# Patient Record
Sex: Female | Born: 1955 | Race: White | Hispanic: No | Marital: Married
Health system: Southern US, Community
[De-identification: ages and names within clinical notes are randomized; demographics above are authoritative.]

---

## 2006-04-20 ENCOUNTER — Ambulatory Visit (HOSPITAL_COMMUNITY): Admission: RE | Admit: 2006-04-20 | Discharge: 2006-04-20 | Payer: Self-pay | Admitting: General Surgery

## 2006-05-04 ENCOUNTER — Encounter (INDEPENDENT_AMBULATORY_CARE_PROVIDER_SITE_OTHER): Payer: Self-pay | Admitting: Specialist

## 2006-05-04 ENCOUNTER — Ambulatory Visit (HOSPITAL_COMMUNITY): Admission: RE | Admit: 2006-05-04 | Discharge: 2006-05-04 | Payer: Self-pay | Admitting: General Surgery

## 2006-05-30 ENCOUNTER — Ambulatory Visit: Payer: Self-pay | Admitting: Oncology

## 2006-07-20 ENCOUNTER — Ambulatory Visit: Payer: Self-pay | Admitting: Oncology

## 2006-07-24 ENCOUNTER — Ambulatory Visit (HOSPITAL_COMMUNITY): Admission: RE | Admit: 2006-07-24 | Discharge: 2006-07-24 | Payer: Self-pay | Admitting: Oncology

## 2006-07-24 LAB — CBC WITH DIFFERENTIAL/PLATELET
Basophils Absolute: 0 10*3/uL (ref 0.0–0.1)
EOS%: 1.6 % (ref 0.0–7.0)
HCT: 40 % (ref 34.8–46.6)
HGB: 13.6 g/dL (ref 11.6–15.9)
LYMPH%: 30.4 % (ref 14.0–48.0)
MCH: 28 pg (ref 26.0–34.0)
MCV: 82.5 fL (ref 81.0–101.0)
MONO%: 6.4 % (ref 0.0–13.0)
NEUT%: 61.3 % (ref 39.6–76.8)

## 2006-07-24 LAB — COMPREHENSIVE METABOLIC PANEL
AST: 21 U/L (ref 0–37)
Alkaline Phosphatase: 102 U/L (ref 39–117)
BUN: 17 mg/dL (ref 6–23)
Calcium: 9.2 mg/dL (ref 8.4–10.5)
Chloride: 103 mEq/L (ref 96–112)
Creatinine, Ser: 1.06 mg/dL (ref 0.40–1.20)
Total Bilirubin: 0.5 mg/dL (ref 0.3–1.2)

## 2006-07-24 IMAGING — CR DG CHEST 2V
2 series · 2 of 2 positions shown · non-contrast
Comparison: none

CLINICAL DATA: 51 year-old with right calf melanoma.
 CHEST - 2 VIEW:

[view not recorded (1 of 2)]
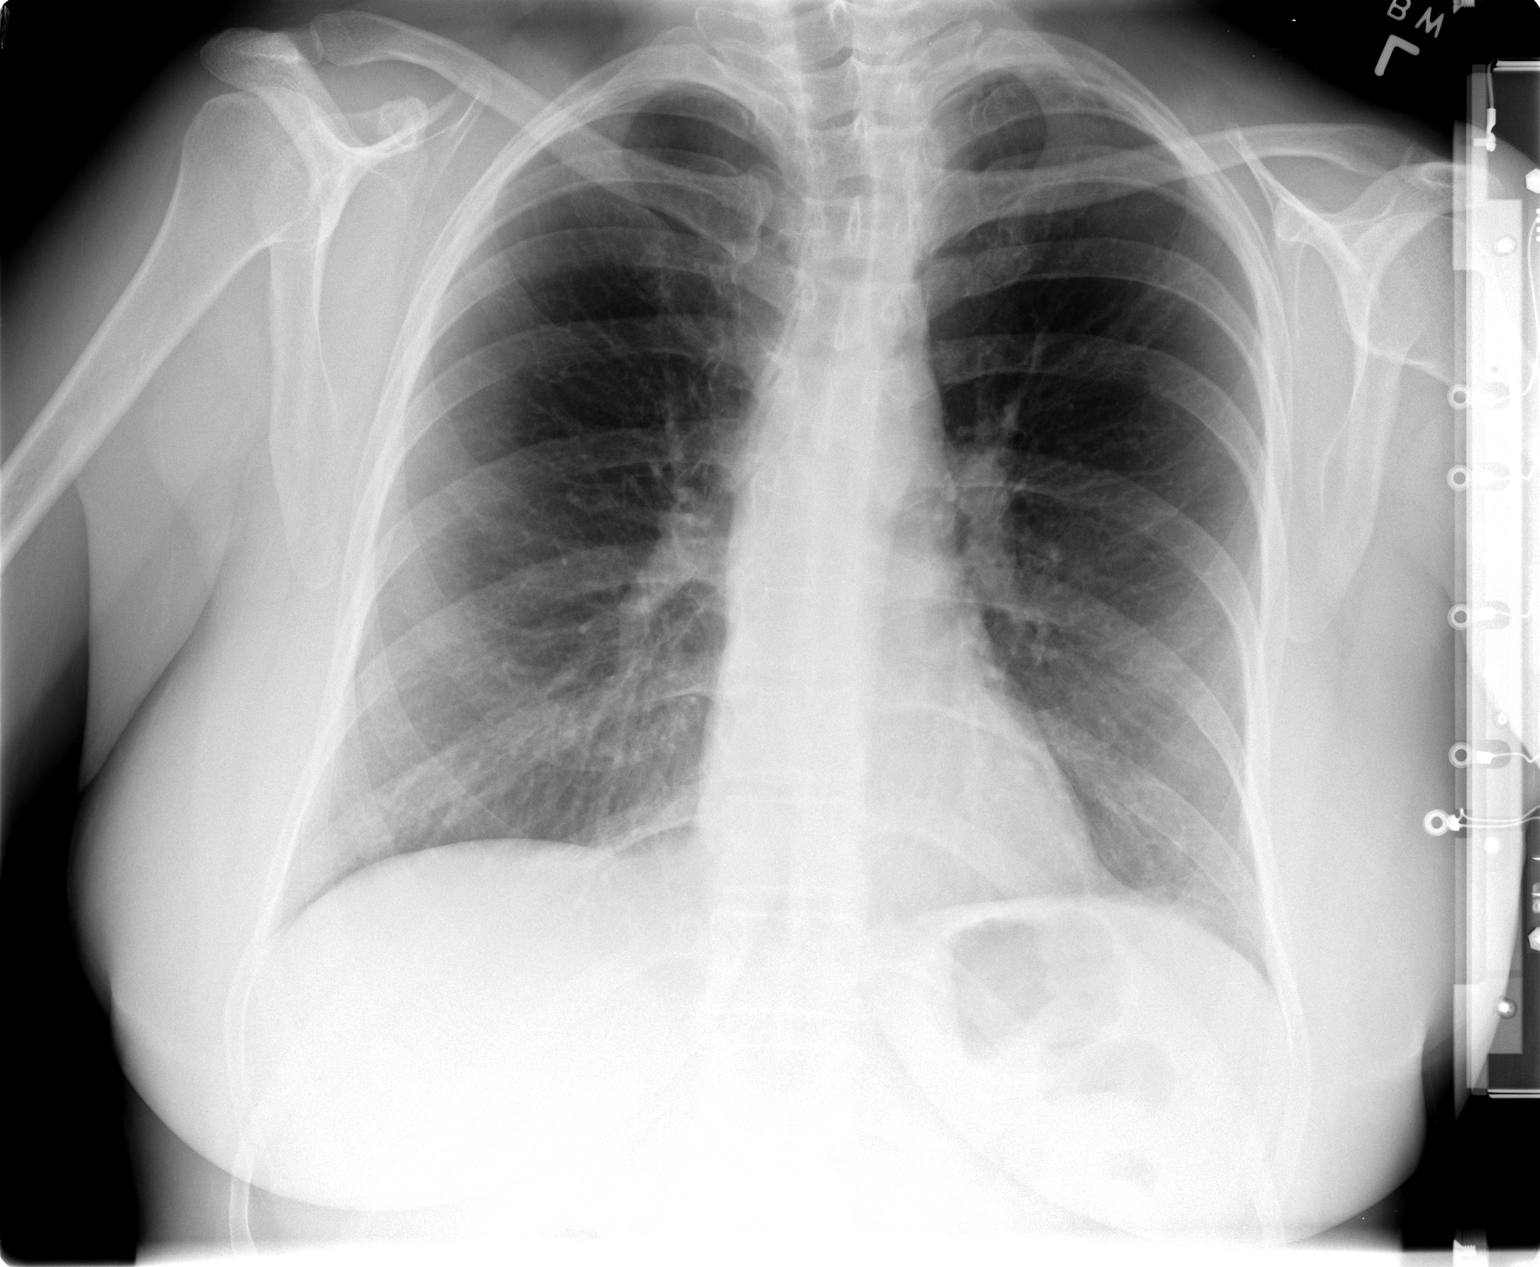

[view not recorded (2 of 2)]
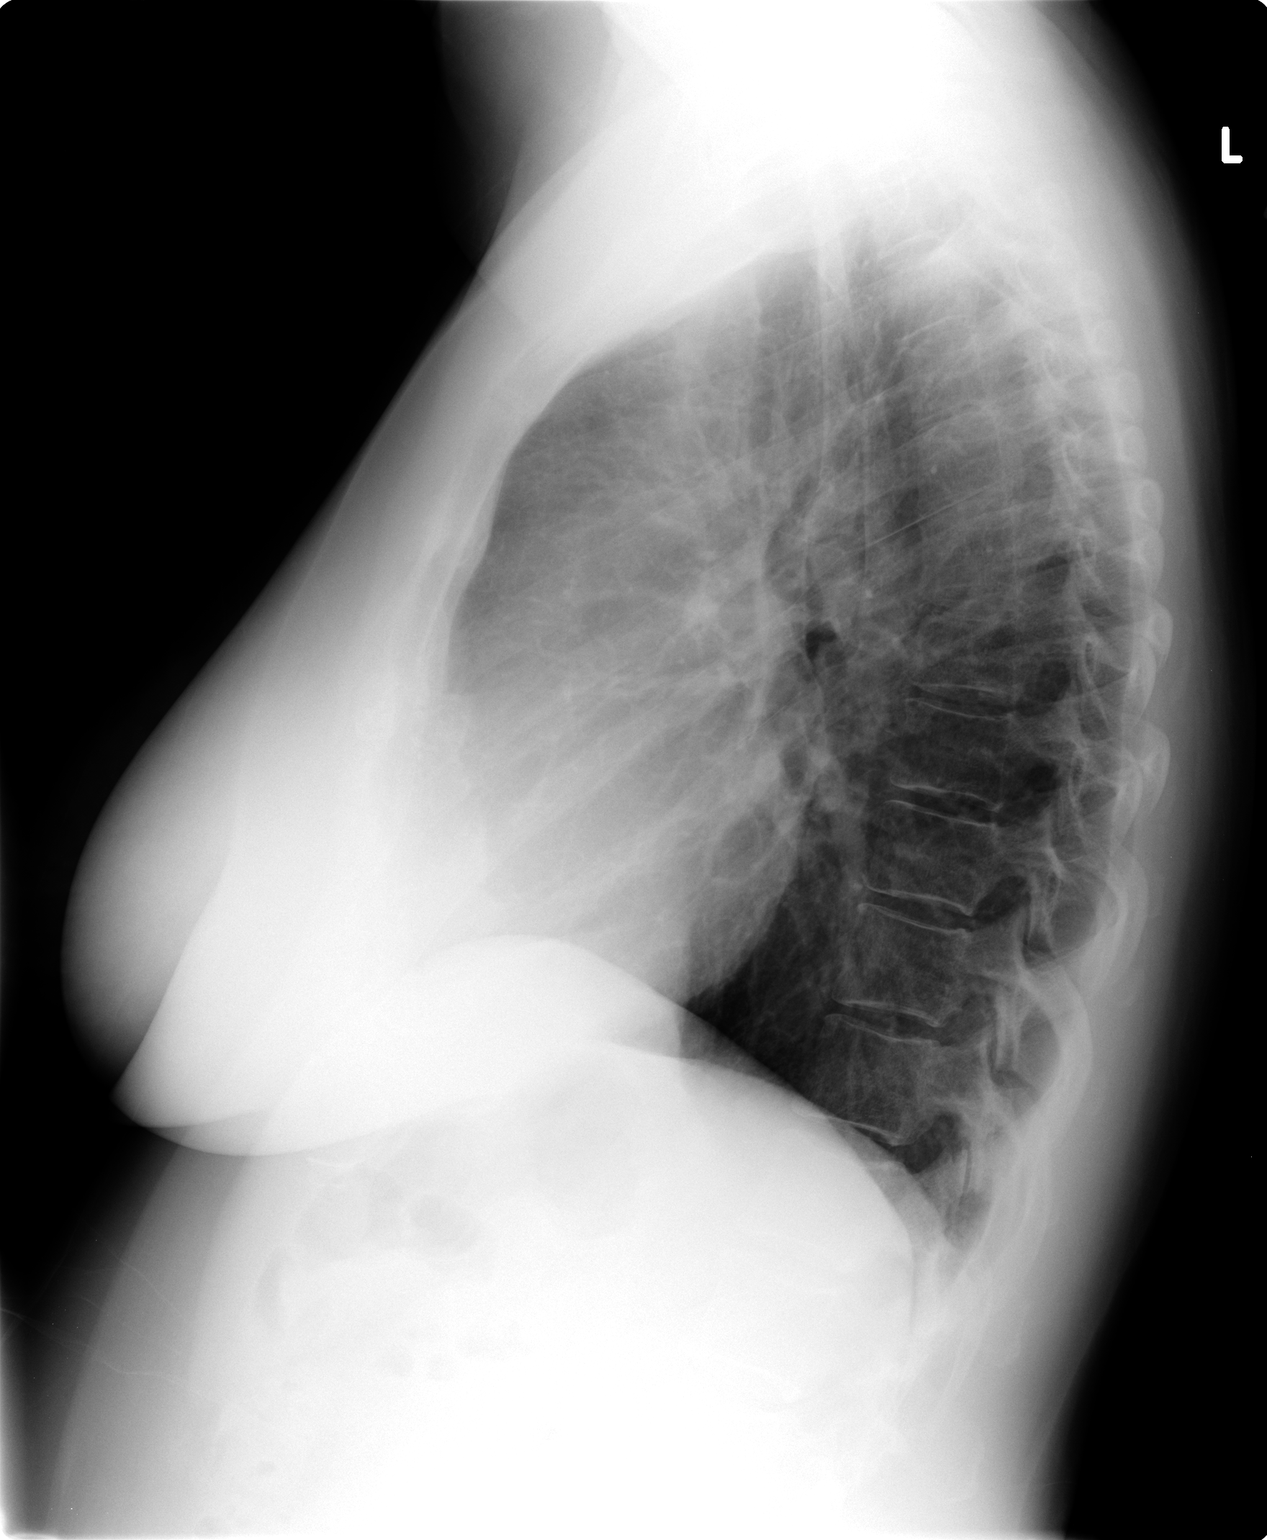

[2 of 2 positions shown; findings below may reference images not displayed]

FINDINGS: Two views of the chest demonstrate clear lungs. Heart and mediastinum are within normal limits. Trachea is midline. The bone structures are intact.
IMPRESSION: Negative chest radiograph.

## 2010-05-28 NOTE — Op Note (Signed)
NAMEAIRANNA, Gloria Vincent               ACCOUNT NO.:  [id number]   MEDICAL RECORD NO.:  [id number]          PATIENT TYPE:  AMB   LOCATION:  SDS                          FACILITY:  [facility]   PHYSICIAN:  Gabrielle Dare. Janee Morn, M.D.DATE OF BIRTH:  1955-01-19   DATE OF PROCEDURE:  05/04/2006  DATE OF DISCHARGE:                               OPERATIVE REPORT   PREOPERATIVE DIAGNOSIS:  Melanoma, right lateral calf.   POSTOPERATIVE DIAGNOSIS:  Melanoma, right lateral calf.   PROCEDURE:  1. Right groin sentinel lymph node biopsy with blue dye injection.  2. Wide excision, melanoma, right lateral calf.  Specimen was 7 x 3.5      cm with layered closure.   SURGEON:  Violeta Gelinas.   ANESTHESIA:  General.   HISTORY OF PRESENT ILLNESS:  Gloria Vincent is a 55 year old female who I  evaluated in the office.  She had a lesion biopsied on her proximal  lateral right calf.  This demonstrated a melanoma 0.87 mm in thickness.  She presents today for elective wide excision and sentinel lymph node  biopsy.  She underwent preoperative lymphoscintigram which demonstrates  her sentinel node in the right inguinal region.   PROCEDURE IN DETAIL:  Informed consent was obtained.  The patient  received intravenous antibiotics.  Her site was marked.  She was brought  to operating room.  General anesthesia with laryngeal mask airway was  administered by the anesthesia staff.  Her right colon groin and calf  were prepped and draped in a sterile fashion.  Attention was first  directed to the groin.  NeoProbe was used to localize the hot spot.   Please make correction after the anesthesia was administered in before  the patient was prepped and draped.   Four mL of 1:4 mixture of methylene blue with saline were injected  intracutaneously around the lesion and then massaged for 4 minutes by  the clock.  Subsequently, the patient's right calf and groin were  prepped and draped in sterile fashion NeoProbe was used to  localize a  hot spot in the right groin. Quarter percent Marcaine with epinephrine  was injected along the planned line of incision.  A small incision was  made over the site of the high signal.  Subcutaneous tissues were  dissected down.  We used the NeoProbe as guidance and localized a blue  lymph node with a very high signal, about 1300 on the NeoProbe.  This  was circumferentially dissected.  Excellent hemostasis was obtained with  the Bovie cautery, and it was excised in one piece.  It was sent to  pathology as a hot blue node.  No further elevated signal was noted in  the groin wound or elsewhere in the groin.  Wound was copiously  irrigated, and  meticulous hemostasis was ensured.  The small lymph  channels had been followed and coagulated as well.  Wound was then  closed in 2 layers.  Subcutaneous tissues were approximated with  interrupted 3-0 Vicryl suture, and the skin was closed with a running 4-  0 Monocryl subcuticular stitch.  Sponge, needle and instrument  counts  were correct.  Benzoin, Steri-Strips and sterile dressing were applied.  Next, attention was directed to the proximal right lateral calf.  Evaluation of the laxity of the tissues was done to determine the line  of excision.  An area giving about 1.5-cm margin around all sides and in  the form of a long ellipse was measured out.  Elliptical incision was  made.  Subcutaneous tissues were dissected down to the fascia, and the  ellipse was excised with the lesion in the center.  The specimen was  marked with suture for orientation for pathology and passed off.  The  wound was irrigated.  Additional local anesthetic was injected.  Meticulous hemostasis was obtained.  Subcutaneous tissues were then  approximated with interrupted 2-0 Vicryl sutures, and the skin was  closed with interrupted 3-0 nylon sutures.  Sponge, needle and  instrument counts were correct.  Sterile dressing was applied.  The  patient's wound came  together well without a significant amount of  tension, and the calf remained soft.  The patient tolerated he procedure  well without apparent complication and was taken to the recovery room in  stable condition.      Gabrielle Dare Janee Morn, M.D.  Electronically Signed     BET/MEDQ  D:  05/04/2006  T:  05/04/2006  Job:  [id number]   cc:   Christianne Dolin, MD

## 2021-05-17 ENCOUNTER — Telehealth: Payer: Self-pay

## 2021-05-17 NOTE — Telephone Encounter (Signed)
REFERRAL ONLY ?

## 2021-05-24 ENCOUNTER — Telehealth: Payer: Self-pay

## 2021-05-24 NOTE — Telephone Encounter (Signed)
NOTES SCANNED TO REFERRAL 

## 2021-06-24 ENCOUNTER — Ambulatory Visit: Payer: Self-pay | Admitting: Cardiology
